# Patient Record
Sex: Male | Born: 1935 | Hispanic: Refuse to answer
Health system: Midwestern US, Academic
[De-identification: ages and names within clinical notes are randomized; demographics above are authoritative.]

---

## 2016-11-11 LAB — LIPID PROFILE
Lab: 108 — ABNORMAL HIGH (ref <100)
Lab: 167
Lab: 21 — ABNORMAL LOW (ref 33.0–37.0)
Lab: 4

## 2016-11-11 LAB — COMPREHENSIVE METABOLIC PANEL
Lab: 0.9
Lab: 141 — ABNORMAL LOW (ref 4.70–6.10)
Lab: 3.8
Lab: 6.9

## 2016-11-11 LAB — CBC: Lab: 8.1

## 2016-11-11 LAB — THYROID STIMULATING HORMONE-TSH: Lab: 1.6

## 2016-11-11 LAB — PROSTATIC SPECIFIC ANTIGEN-PSA: Lab: 1.8

## 2016-12-12 ENCOUNTER — Encounter: Admit: 2016-12-12 | Discharge: 2016-12-12 | Payer: BC Managed Care – PPO

## 2016-12-13 MED ORDER — ATENOLOL 50 MG PO TAB
ORAL_TABLET | Freq: Every day | ORAL | 0 refills | 33.00000 days | Status: AC
Start: 2016-12-13 — End: 2017-03-02

## 2017-03-02 ENCOUNTER — Ambulatory Visit: Admit: 2017-03-02 | Discharge: 2017-03-03 | Payer: BC Managed Care – PPO

## 2017-03-02 ENCOUNTER — Encounter: Admit: 2017-03-02 | Discharge: 2017-03-02 | Payer: BC Managed Care – PPO

## 2017-03-02 DIAGNOSIS — Z951 Presence of aortocoronary bypass graft: Principal | ICD-10-CM

## 2017-03-02 DIAGNOSIS — E7800 Pure hypercholesterolemia, unspecified: ICD-10-CM

## 2017-03-02 DIAGNOSIS — I25119 Atherosclerotic heart disease of native coronary artery with unspecified angina pectoris: Principal | ICD-10-CM

## 2017-03-02 DIAGNOSIS — I2510 Atherosclerotic heart disease of native coronary artery without angina pectoris: ICD-10-CM

## 2017-03-02 DIAGNOSIS — I779 Disorder of arteries and arterioles, unspecified: ICD-10-CM

## 2017-03-02 DIAGNOSIS — Z720 Tobacco use: ICD-10-CM

## 2017-03-02 MED ORDER — ATENOLOL 50 MG PO TAB
50 mg | ORAL_TABLET | Freq: Every day | ORAL | 3 refills | 33.00000 days | Status: AC
Start: 2017-03-02 — End: 2018-06-22

## 2017-03-17 LAB — BASIC METABOLIC PANEL
Lab: 0.8
Lab: 10 — ABNORMAL HIGH (ref 8.8–10.0)
Lab: 108
Lab: 15 — ABNORMAL HIGH (ref 0–14)
Lab: 89

## 2017-10-05 ENCOUNTER — Ambulatory Visit: Admit: 2017-10-05 | Discharge: 2017-10-06 | Payer: BC Managed Care – PPO

## 2017-10-05 ENCOUNTER — Encounter: Admit: 2017-10-05 | Discharge: 2017-10-05 | Payer: BC Managed Care – PPO

## 2017-10-05 DIAGNOSIS — E7800 Pure hypercholesterolemia, unspecified: Principal | ICD-10-CM

## 2017-10-05 DIAGNOSIS — I25119 Atherosclerotic heart disease of native coronary artery with unspecified angina pectoris: ICD-10-CM

## 2017-10-05 DIAGNOSIS — I779 Disorder of arteries and arterioles, unspecified: ICD-10-CM

## 2017-10-05 DIAGNOSIS — Z720 Tobacco use: ICD-10-CM

## 2017-10-05 DIAGNOSIS — I2510 Atherosclerotic heart disease of native coronary artery without angina pectoris: ICD-10-CM

## 2017-10-05 DIAGNOSIS — Z951 Presence of aortocoronary bypass graft: Principal | ICD-10-CM

## 2017-10-06 ENCOUNTER — Encounter: Admit: 2017-10-06 | Discharge: 2017-10-06 | Payer: BC Managed Care – PPO

## 2018-06-21 ENCOUNTER — Encounter: Admit: 2018-06-21 | Discharge: 2018-06-21

## 2018-06-22 MED ORDER — ATENOLOL 50 MG PO TAB
ORAL_TABLET | Freq: Every day | ORAL | 3 refills | 33.00000 days | Status: DC
Start: 2018-06-22 — End: 2019-08-30

## 2018-06-22 NOTE — Telephone Encounter
Dear Dr. Isidore Moos,     Caleb Morrow (DOB [birth year]) is also followed by cardiologist, Dr. Rosalin Hawking, MD.    Please send the following records for continuity of care:    Most recent lipid and liver profile, chemistry panel, and thyroid panel.    Please fax results to:     [facility]  Dionicia Abler / Kathe Mariner office fax: [phone]               Thank you,     Seth Bake, RN  Please call [phone] with any questions or concerns.

## 2019-02-28 IMAGING — CR CHEST
2 series · 2 of 2 positions shown · non-contrast
Comparison: none

[chest pa x-wise]
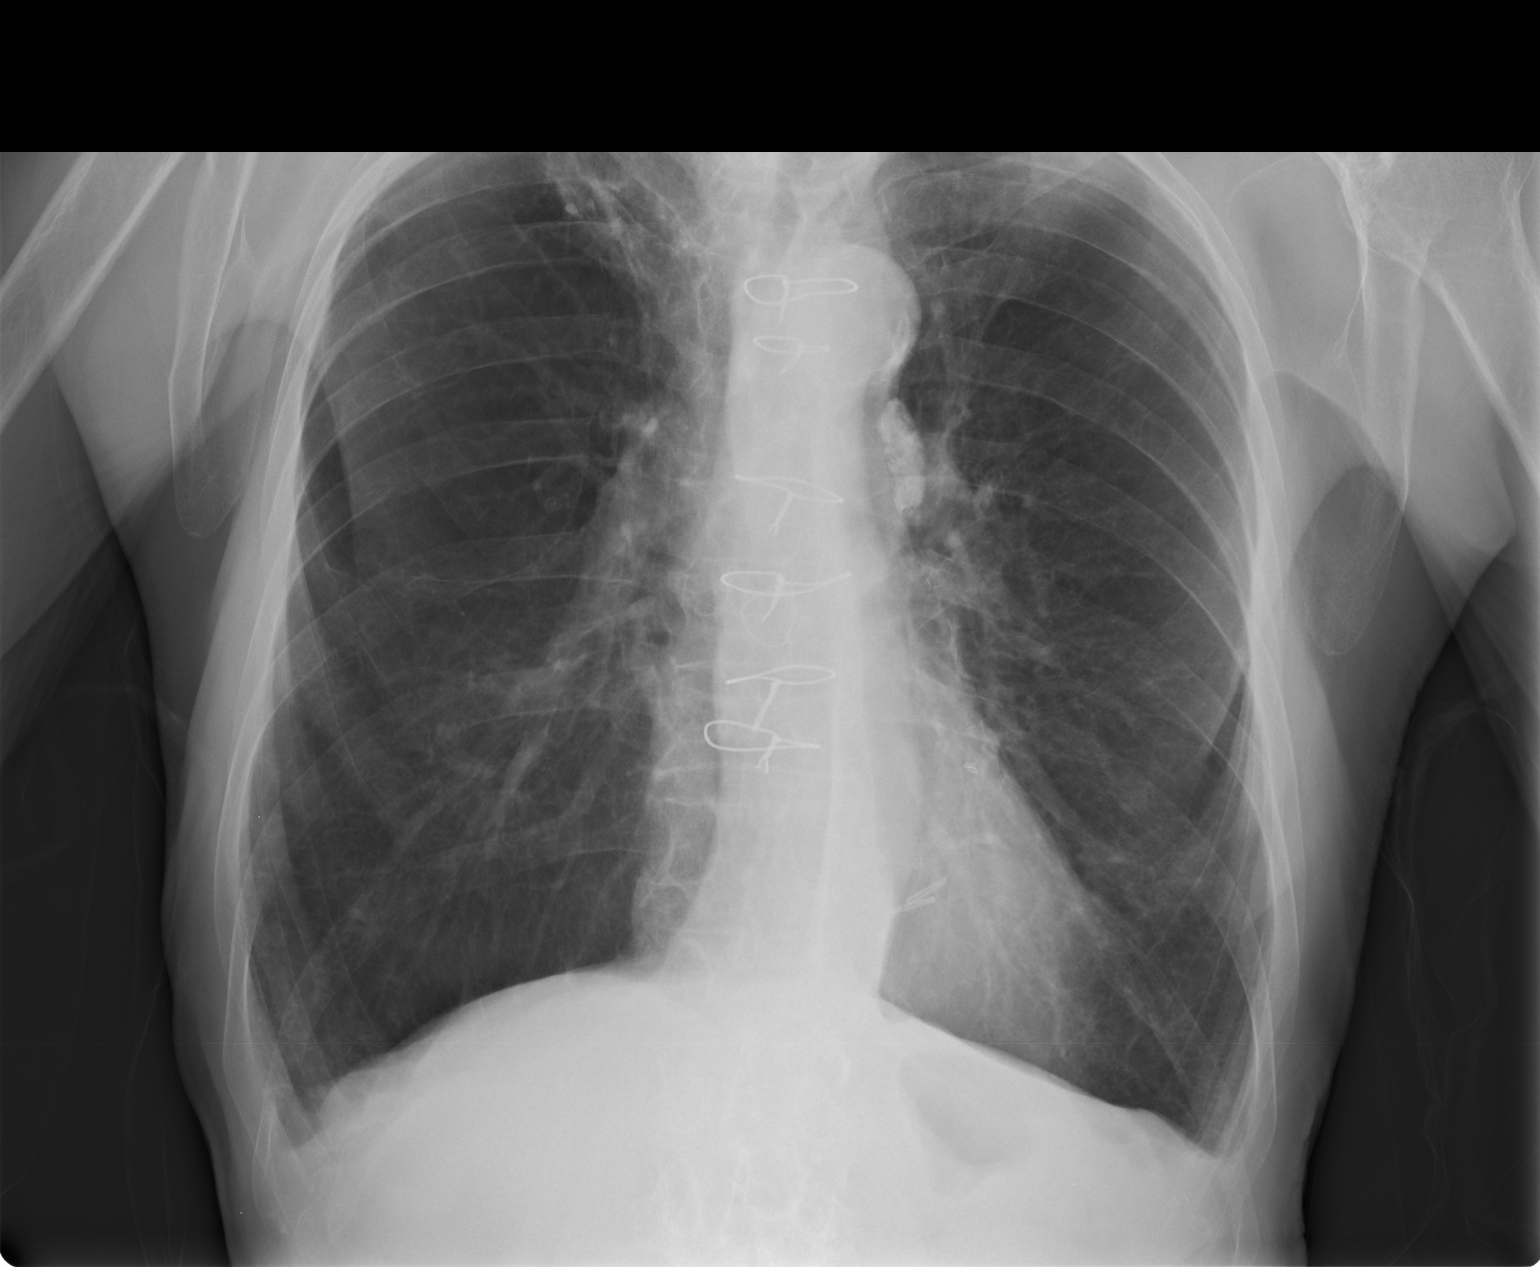

[chest lat]
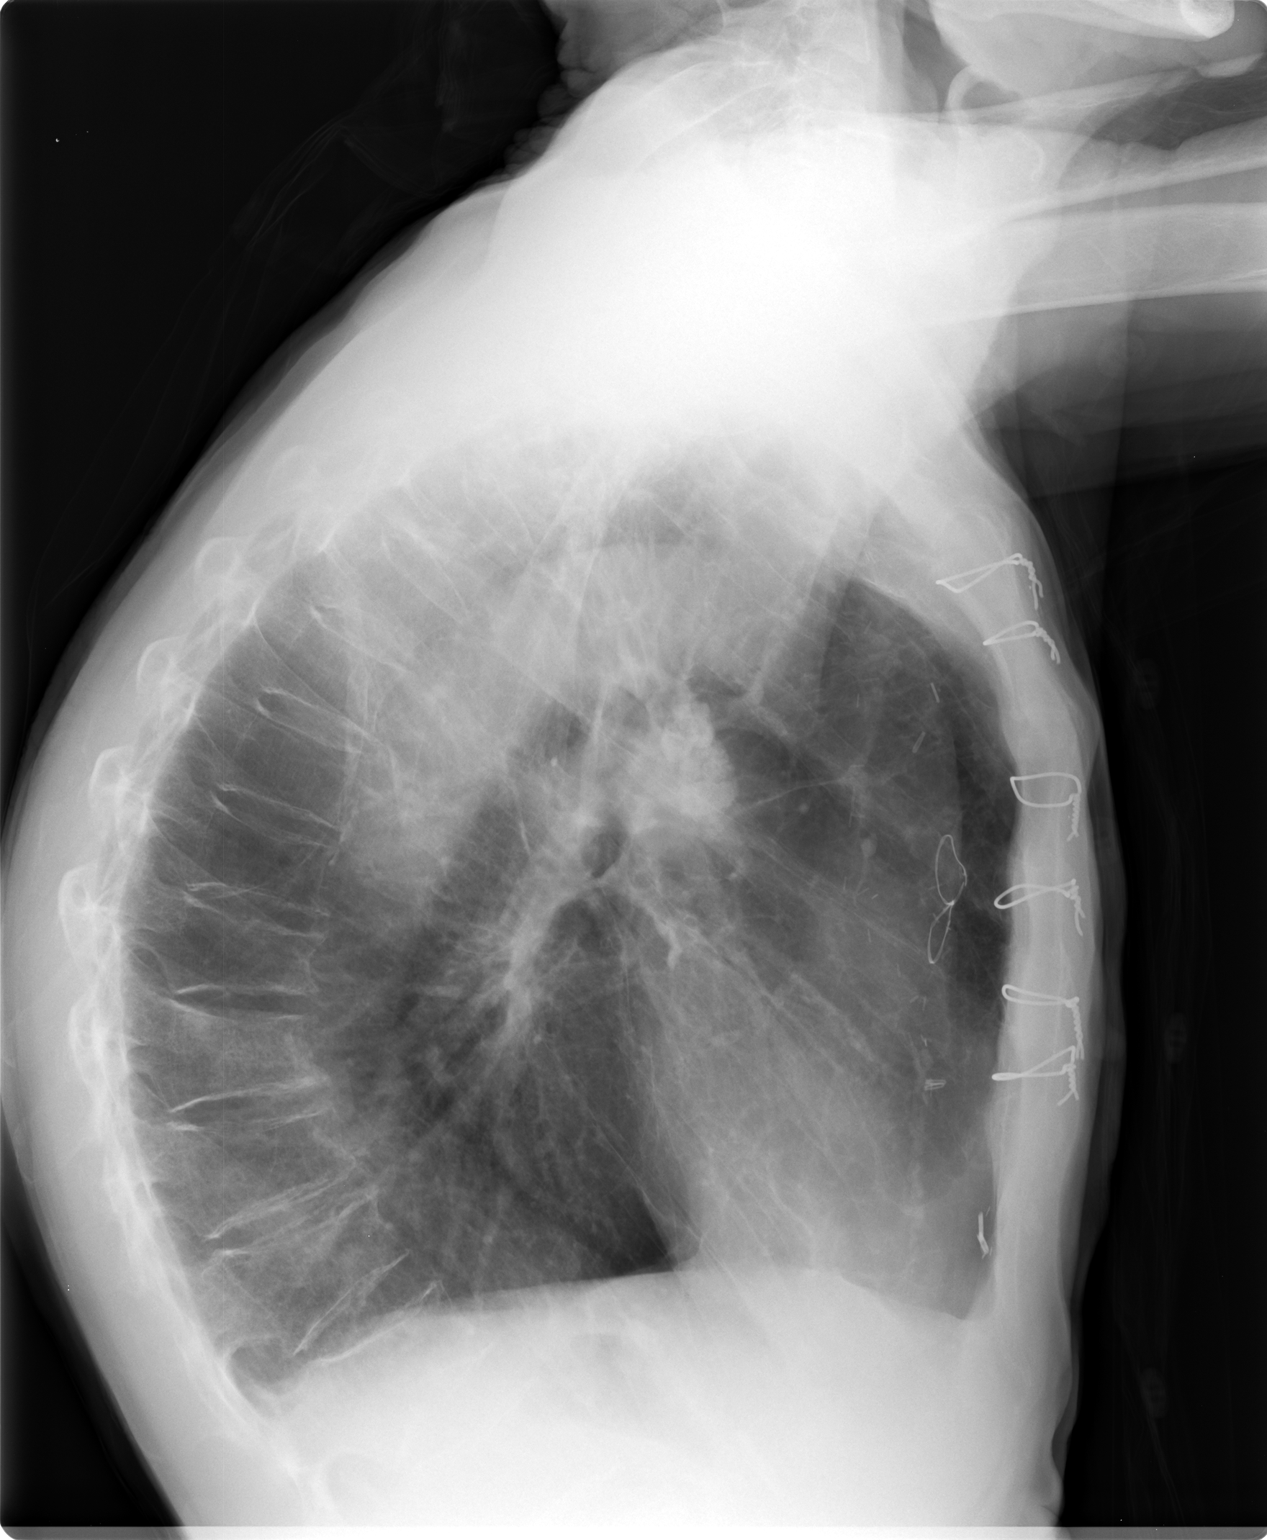

[2 of 2 positions shown; findings below may reference images not displayed]

DIAGNOSTIC STUDIES

EXAM

Chest radiographs.

INDICATION

upper back pain, crackles heard on exam
PT. C/O PAIN BETWEEN SHOULDER BLADES, AND COUGH.

TECHNIQUE

PA and lateral views of the chest.

COMPARISONS

05 February, 2015.

FINDINGS

Pulmonary hyperinflation compatible with COPD. Evidence of old granulomatous infection. Median
sternotomy wires. Anterior wedging of mid and lower thoracic vertebral bodies is again appreciated.
No new consolidation, effusion, or pneumothorax.

IMPRESSION

COPD. No compelling evidence of acute cardiopulmonary pathology.

## 2019-08-30 ENCOUNTER — Encounter: Admit: 2019-08-30 | Discharge: 2019-08-30 | Payer: BC Managed Care – PPO

## 2019-08-30 MED ORDER — ATENOLOL 50 MG PO TAB
ORAL_TABLET | Freq: Every day | ORAL | 3 refills | 33.00000 days | Status: AC
Start: 2019-08-30 — End: ?

## 2020-11-09 ENCOUNTER — Encounter: Admit: 2020-11-09 | Discharge: 2020-11-09 | Payer: BC Managed Care – PPO

## 2020-11-09 MED ORDER — ATENOLOL 50 MG PO TAB
ORAL_TABLET | Freq: Every day | 0 refills
Start: 2020-11-09 — End: ?

## 2020-11-11 ENCOUNTER — Encounter: Admit: 2020-11-11 | Discharge: 2020-11-11 | Payer: BC Managed Care – PPO

## 2020-11-11 MED ORDER — ATENOLOL 50 MG PO TAB
ORAL_TABLET | Freq: Every day | ORAL | 3 refills | 33.00000 days | Status: AC
Start: 2020-11-11 — End: ?

## 2022-02-17 DEATH — deceased

## 2022-12-29 IMAGING — CR HIPCMLT
3 series · 3 of 3 positions shown · non-contrast
Comparison: none

[hip ap pelvis]
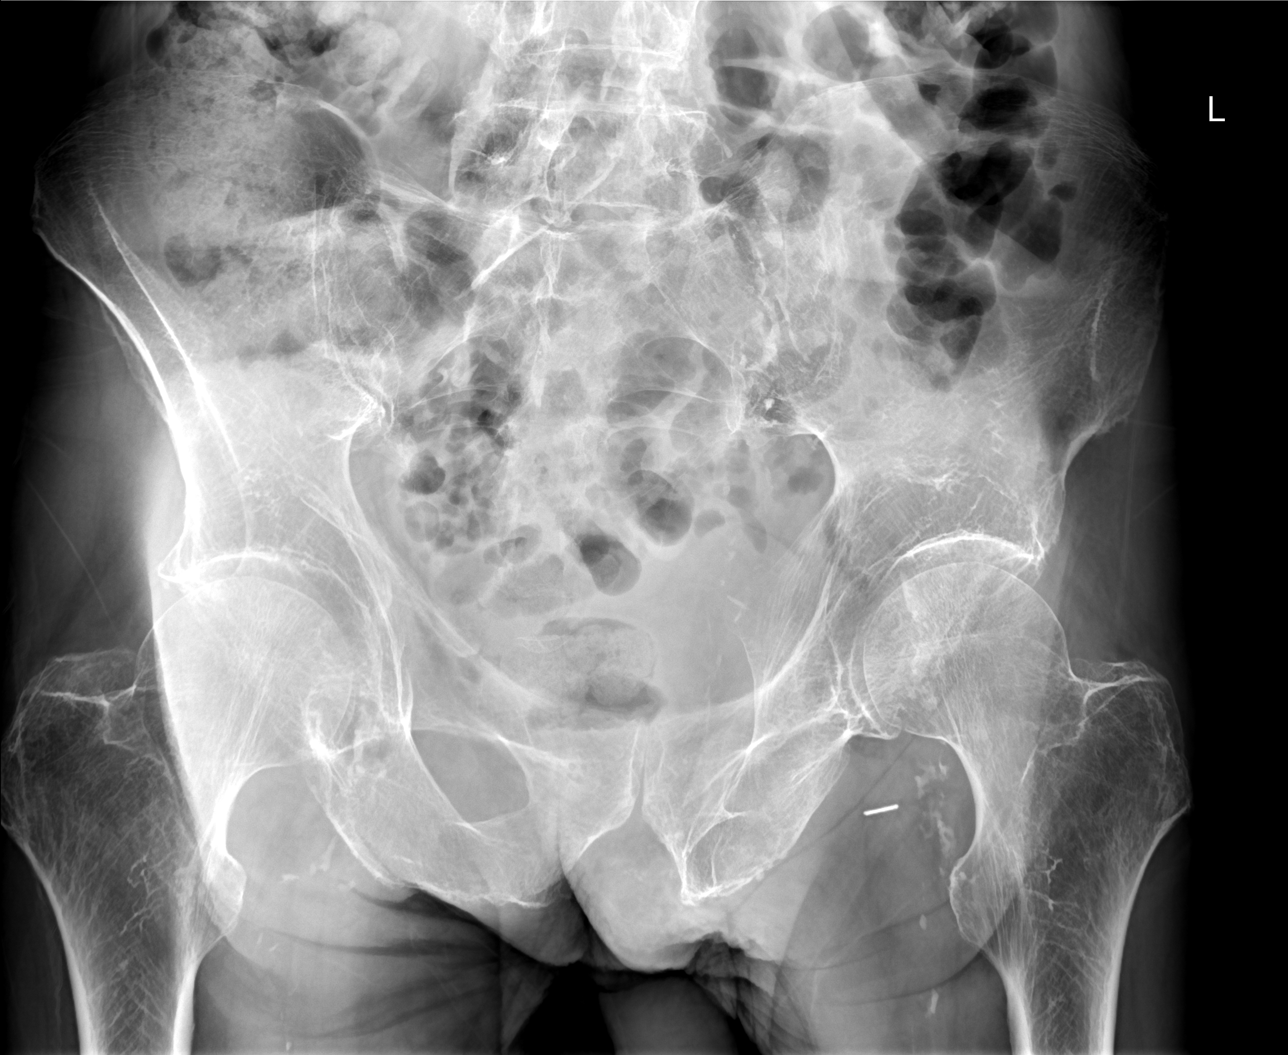

[hip ap]
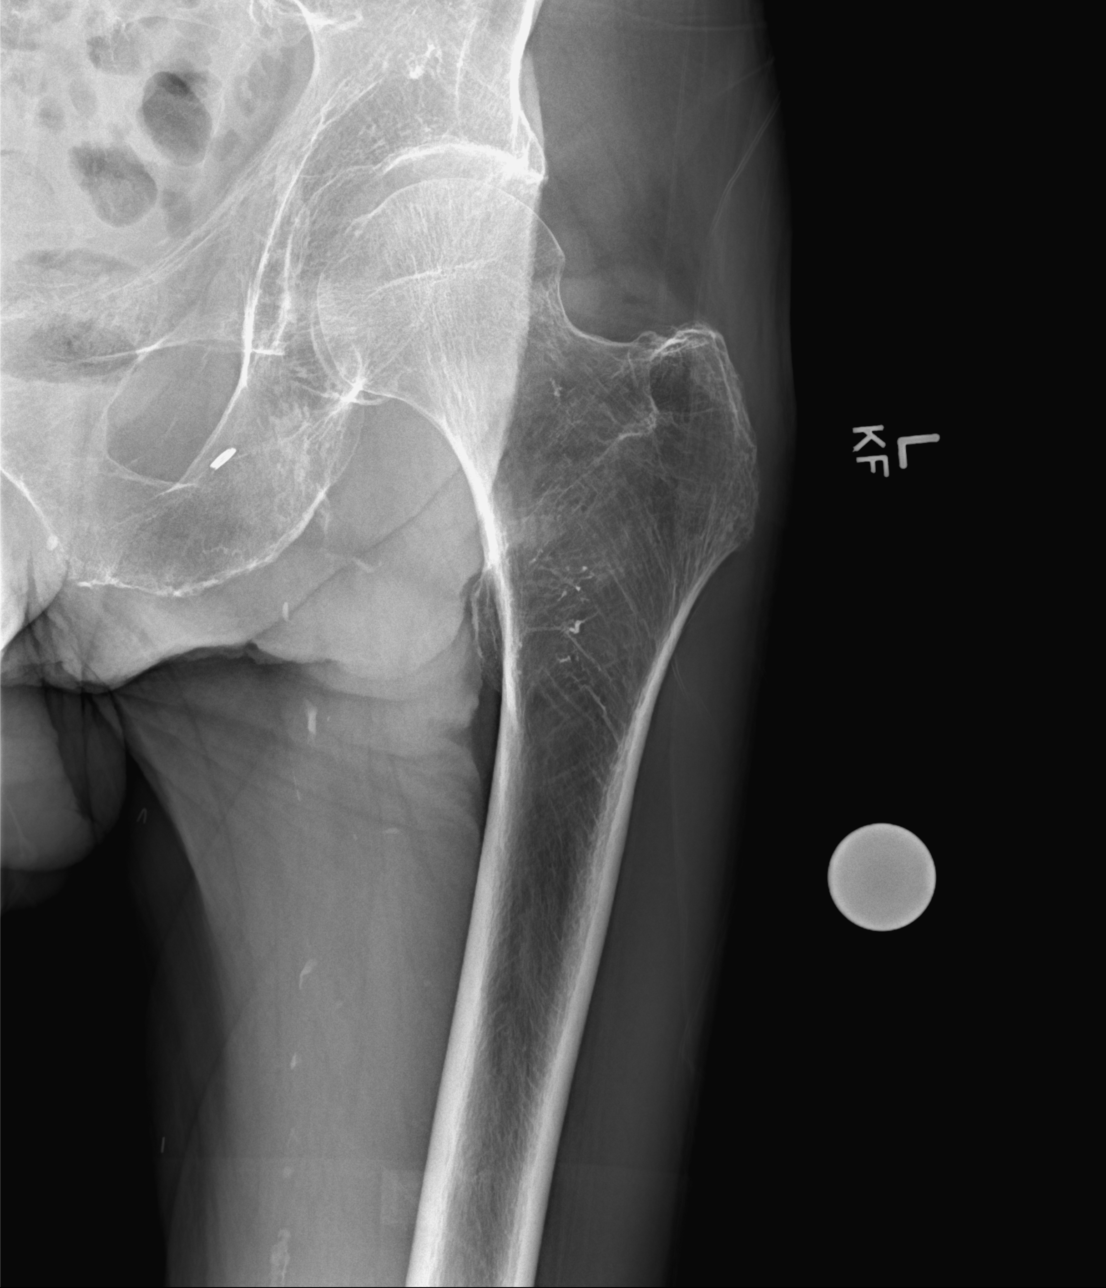

[hip frog lat]
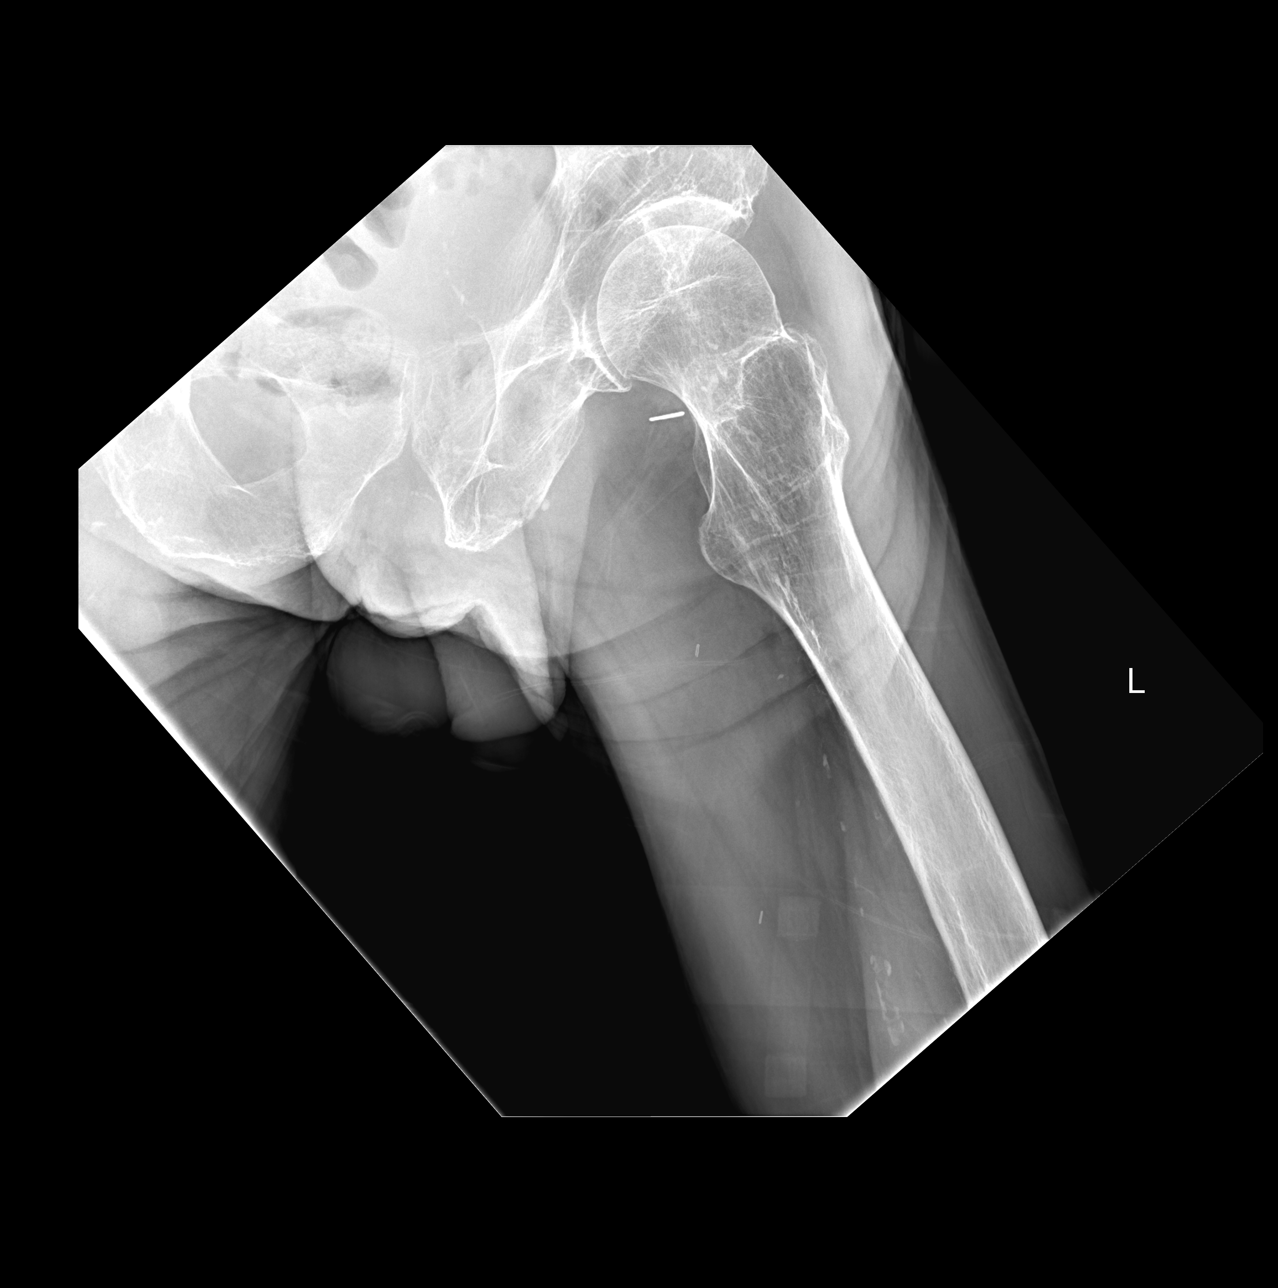

[3 of 3 positions shown; findings below may reference images not displayed]

EXAM

XR hip LT, 2-3V w or wo pelvis

INDICATION

left hip pain
lt hip pain- no known trauma, hx of open heart surgery

TECHNIQUE

XR hip LT, 2-3V w or wo pelvis

COMPARISONS

None available

FINDINGS

Osseous demineralization. No fracture or dislocation. Vascular calcifications. Mild degenerative
changes of the hips.

IMPRESSION

Osseous demineralization. No fracture identified.

Tech Notes:

lt hip pain- no known trauma, hx of open heart surgery
